# Patient Record
Sex: Male | Born: 1990 | Hispanic: No | Marital: Single
Health system: Midwestern US, Academic
[De-identification: ages and names within clinical notes are randomized; demographics above are authoritative.]

---

## 2019-09-14 ENCOUNTER — Encounter: Admit: 2019-09-14 | Discharge: 2019-09-14 | Payer: BC Managed Care – PPO

## 2019-09-14 ENCOUNTER — Ambulatory Visit: Admit: 2019-09-14 | Discharge: 2019-09-15 | Payer: BC Managed Care – PPO

## 2019-09-14 NOTE — Progress Notes
Date of Service: 09/14/2019    Gregory Casey is a 28 y.o. male.  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  He says the pain began in his epigastric area he says the pain began in his epigastric area 5 days ago and none moved to the right upper quadrant about 2 days ago.  He describes the pain is shooting and intermittent.  He denies having nausea or vomiting or fever.  He does note that his stools became clay colored sometime during the last 5 days.  He denies recent travel or exposure to anyone with hepatitis.  He started a new antidepressant medicine 2 days ago.  Antidepressant medicine 2 days ago.  He does not drink alcohol to excess and he is not on any over-the-counter medications.  He notes that his mother has had a cholecystectomy  He lives with his girlfriend and their child.  Neither of them have become ill.       Review of Systems   Constitutional: Negative for chills, fatigue, fever and unexpected weight change.   Respiratory: Negative for cough.    Cardiovascular: Negative for chest pain.   Gastrointestinal: Positive for abdominal pain. Negative for constipation, diarrhea, nausea and vomiting.   Musculoskeletal: Negative for joint swelling.   Neurological: Negative for weakness and headaches.         Objective:         No current outpatient medications on file.     Vitals:    09/14/19 1330   BP: 125/77   Pulse: 74   Resp: 16   Temp: 37.1 ?C (98.8 ?F)   SpO2: 99%   Weight: 70.3 kg (155 lb)   PainSc: Two     There is no height or weight on file to calculate BMI.     Physical Exam  Vitals signs and nursing note reviewed.   Constitutional:       Appearance: He is well-developed.   Eyes:      General: No scleral icterus.  Cardiovascular:      Rate and Rhythm: Normal rate and regular rhythm.      Pulses: Normal pulses.      Heart sounds: Normal heart sounds.   Pulmonary: Effort: Pulmonary effort is normal.      Breath sounds: Normal breath sounds.   Abdominal:      General: Abdomen is flat. Bowel sounds are normal.      Palpations: Abdomen is soft. There is no mass.      Tenderness: There is no abdominal tenderness. There is no guarding.   Neurological:      Mental Status: He is alert and oriented to person, place, and time.   Psychiatric:         Behavior: Behavior normal.         Thought Content: Thought content normal.         Judgment: Judgment normal.              Assessment and Plan:  1. RUQ abdominal pain       This has been associated with what sounds like acholic stools.  Most likely diagnosis is cholelithiasis with common duct stone which she may have passed.  Some type of hepatitis is also possible although patient's liver is not tender or enlarged and he has  not had associated symptoms such as nausea and vomiting.  Plan:  Patient needs ultrasound or CT scan of the upper abdomen and chemistry profile.  None of this can be done here.  He was referred to primary care physician or to the emergency room.
